# Patient Record
Sex: Female | Born: 1975 | Race: White | Hispanic: No | Marital: Married | Smoking: Former smoker
Health system: Southern US, Community
[De-identification: ages and names within clinical notes are randomized; demographics above are authoritative.]

## PROBLEM LIST (undated history)

## (undated) DIAGNOSIS — R569 Unspecified convulsions: Secondary | ICD-10-CM

## (undated) DIAGNOSIS — F32A Depression, unspecified: Secondary | ICD-10-CM

## (undated) DIAGNOSIS — T7840XA Allergy, unspecified, initial encounter: Secondary | ICD-10-CM

## (undated) DIAGNOSIS — K5190 Ulcerative colitis, unspecified, without complications: Secondary | ICD-10-CM

## (undated) DIAGNOSIS — F329 Major depressive disorder, single episode, unspecified: Secondary | ICD-10-CM

## (undated) DIAGNOSIS — F419 Anxiety disorder, unspecified: Secondary | ICD-10-CM

## (undated) HISTORY — DX: Allergy, unspecified, initial encounter: T78.40XA

## (undated) HISTORY — DX: Depression, unspecified: F32.A

## (undated) HISTORY — DX: Major depressive disorder, single episode, unspecified: F32.9

## (undated) HISTORY — DX: Unspecified convulsions: R56.9

## (undated) HISTORY — DX: Ulcerative colitis, unspecified, without complications: K51.90

## (undated) HISTORY — DX: Anxiety disorder, unspecified: F41.9

---

## 2003-01-12 ENCOUNTER — Other Ambulatory Visit: Admission: RE | Admit: 2003-01-12 | Discharge: 2003-01-12 | Payer: Self-pay | Admitting: Obstetrics and Gynecology

## 2004-02-28 ENCOUNTER — Other Ambulatory Visit: Admission: RE | Admit: 2004-02-28 | Discharge: 2004-02-28 | Payer: Self-pay | Admitting: Obstetrics and Gynecology

## 2005-03-26 ENCOUNTER — Other Ambulatory Visit: Admission: RE | Admit: 2005-03-26 | Discharge: 2005-03-26 | Payer: Self-pay | Admitting: Obstetrics and Gynecology

## 2007-08-04 IMAGING — CR DG CHEST 2V
1 series · 2 of 2 positions shown · non-contrast
Comparison: NONE

CLINICAL DATA: Shortness of breath. 

CHEST TWO VIEW (PA AND LATERAL)

[Series 1: view not recorded · 0.17mm/px · 2 of 2 slices shown]
[im 1/2]
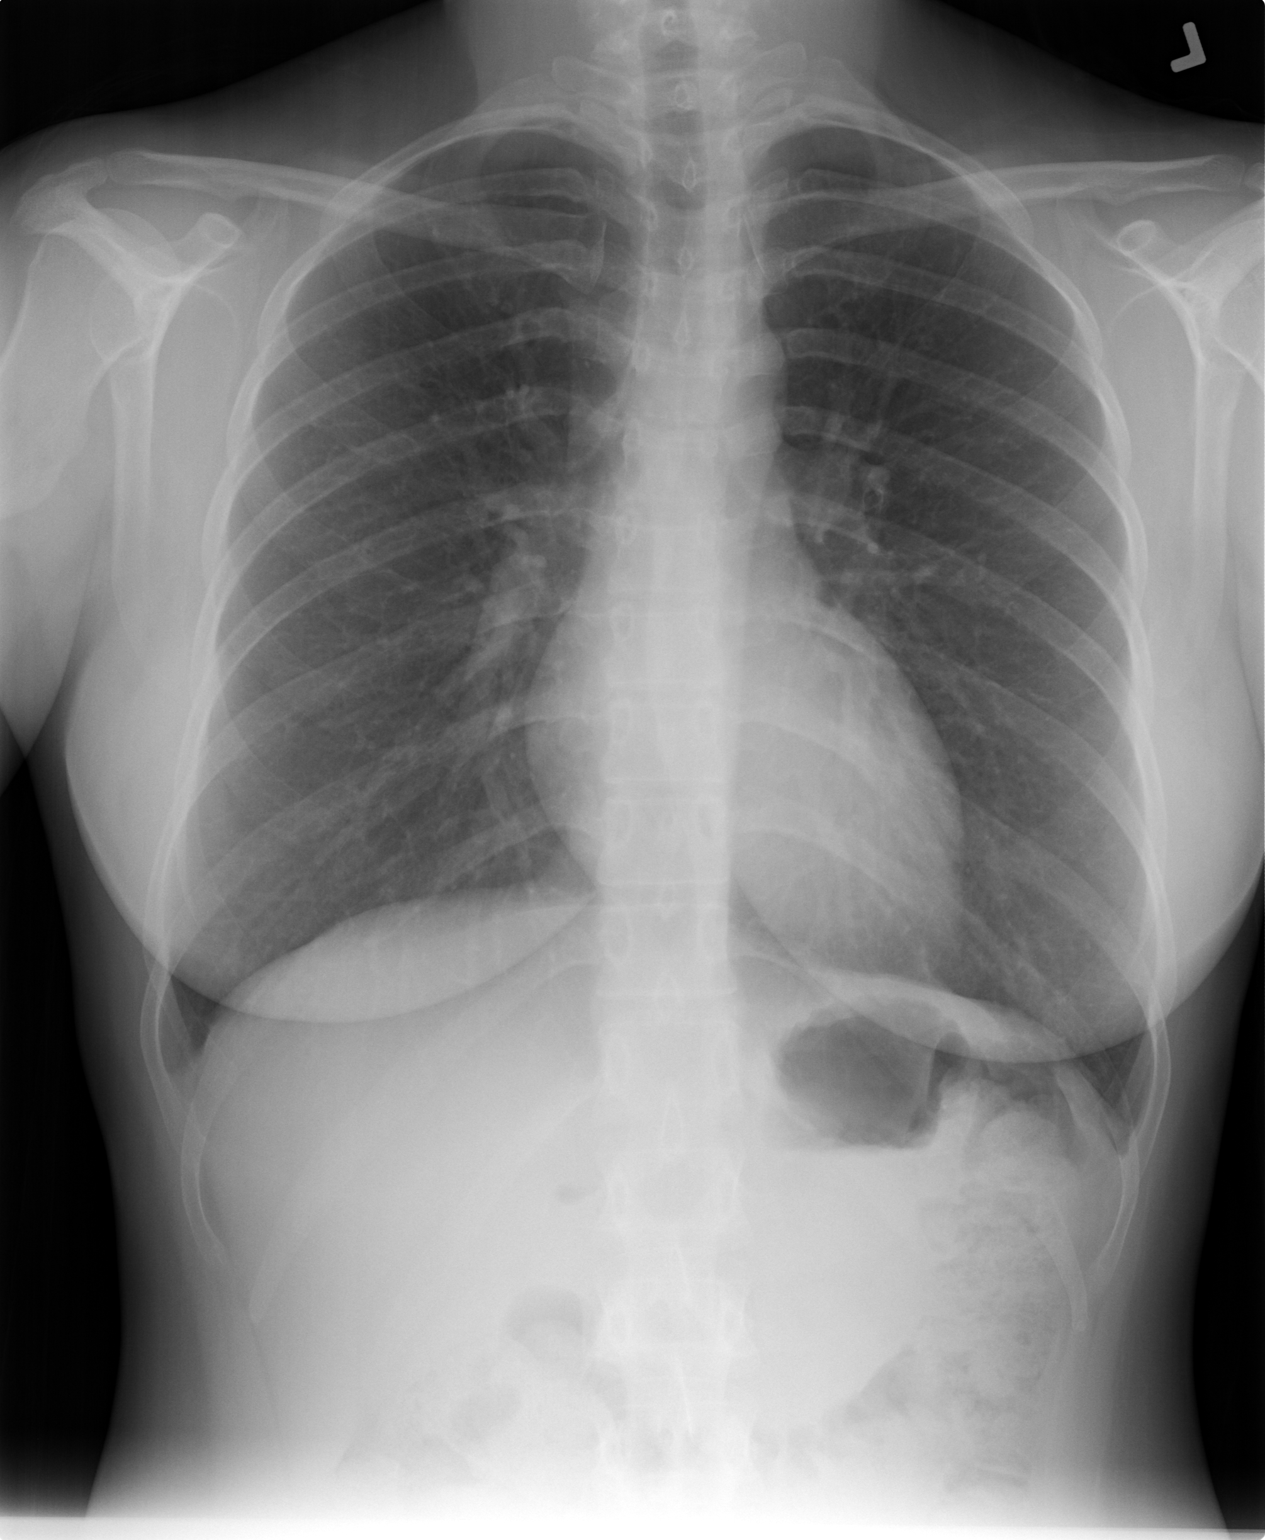
[im 2/2]
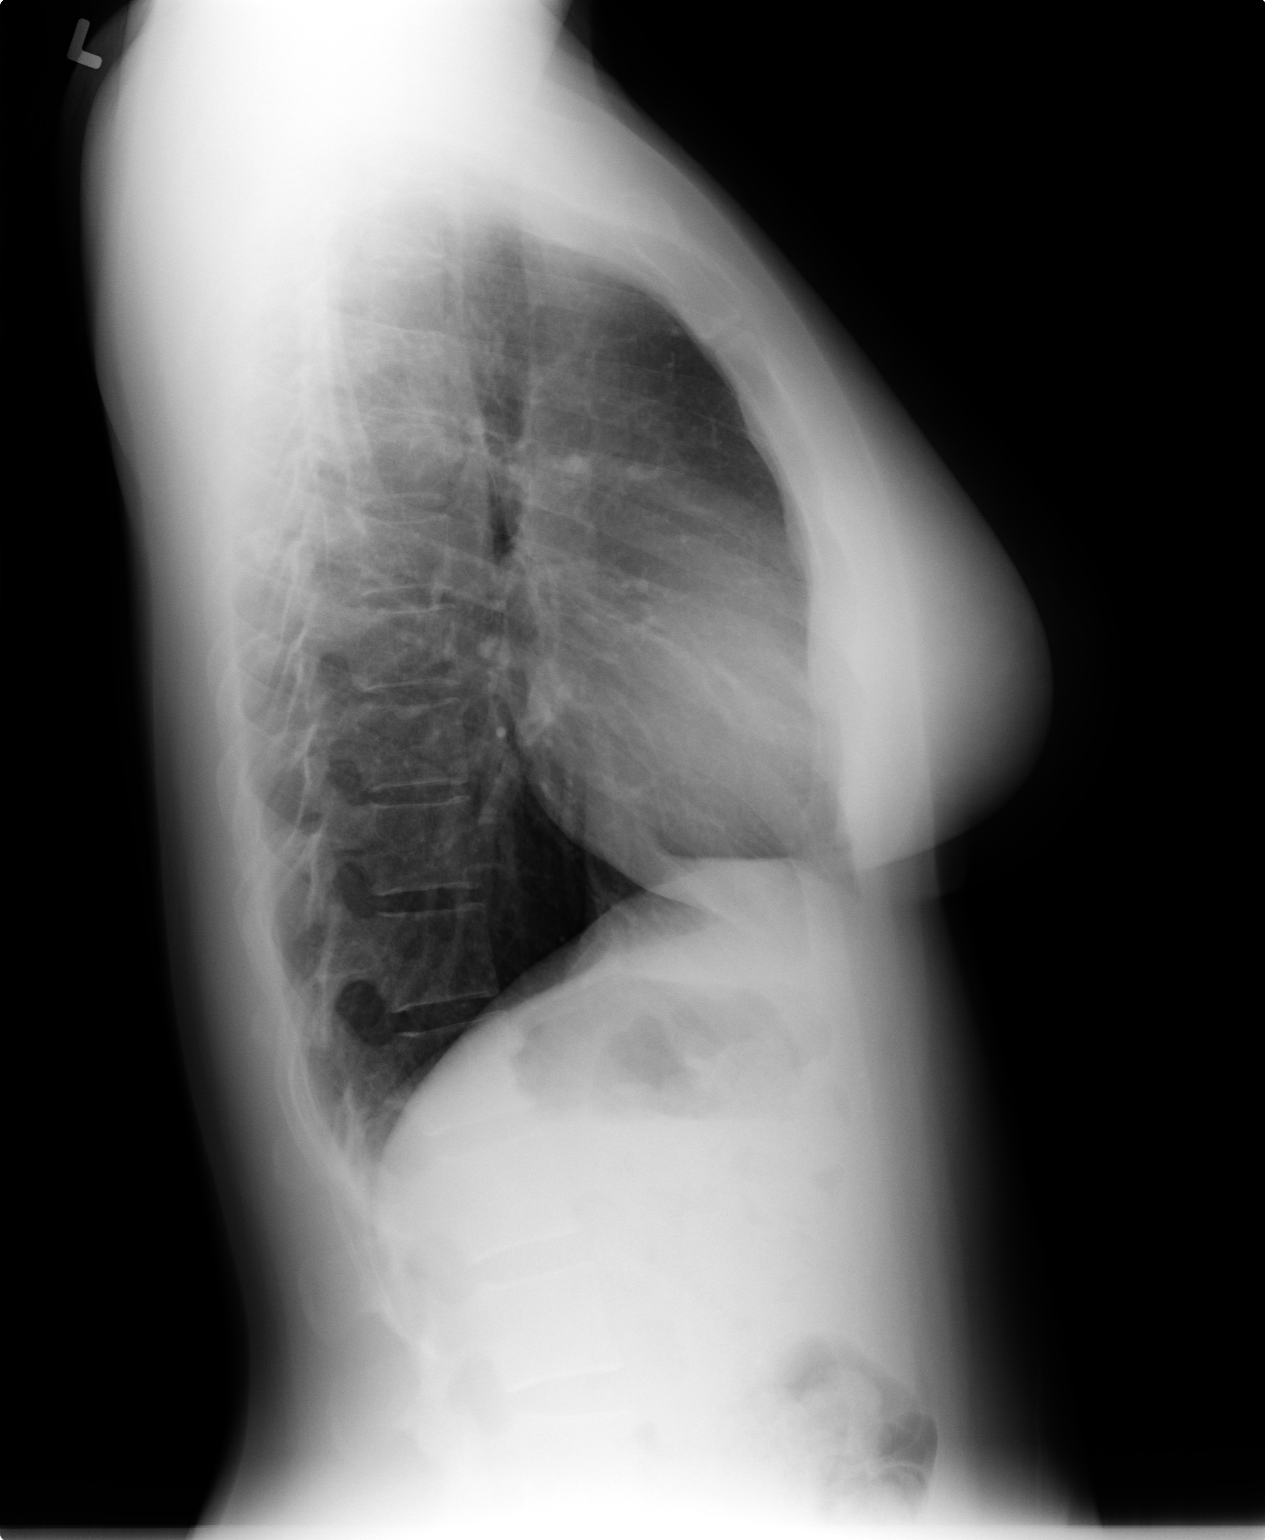

[2 of 2 positions shown; findings below may reference images not displayed]

FINDINGS: The lungs are clear and well expanded.   Heart and 
pulmonary vessels are normal.  No significant abnormalities are 
noted in the regional skeleton.  No evidence of thoracic aortic 
aneurysm or dissection. 

electronically reviewed on [DATE] Dict Date: [DATE]  Tran 
Date:  [DATE] DAS  [REDACTED]

## 2010-10-11 ENCOUNTER — Inpatient Hospital Stay (HOSPITAL_COMMUNITY)
Admission: AD | Admit: 2010-10-11 | Discharge: 2010-10-18 | DRG: 778 | Disposition: A | Discharge status: Home / Self Care | Payer: PRIVATE HEALTH INSURANCE | Source: Ambulatory Visit | Attending: Obstetrics and Gynecology | Admitting: Obstetrics and Gynecology

## 2010-10-11 DIAGNOSIS — O368390 Maternal care for abnormalities of the fetal heart rate or rhythm, unspecified trimester, not applicable or unspecified: Secondary | ICD-10-CM

## 2010-10-11 DIAGNOSIS — O4700 False labor before 37 completed weeks of gestation, unspecified trimester: Principal | ICD-10-CM | POA: Diagnosis present

## 2010-10-11 DIAGNOSIS — O4100X0 Oligohydramnios, unspecified trimester, not applicable or unspecified: Secondary | ICD-10-CM | POA: Diagnosis present

## 2010-10-11 DIAGNOSIS — O321XX0 Maternal care for breech presentation, not applicable or unspecified: Secondary | ICD-10-CM | POA: Diagnosis present

## 2010-10-11 LAB — CBC
Hemoglobin: 13 g/dL (ref 12.0–15.0)
MCH: 31.6 pg (ref 26.0–34.0)
MCHC: 33.9 g/dL (ref 30.0–36.0)
MCV: 93.2 fL (ref 78.0–100.0)
Platelets: 312 10*3/uL (ref 150–400)
RBC: 4.11 MIL/uL (ref 3.87–5.11)

## 2010-10-11 LAB — URINE MICROSCOPIC-ADD ON

## 2010-10-11 LAB — URINALYSIS, ROUTINE W REFLEX MICROSCOPIC
Glucose, UA: NEGATIVE mg/dL
Ketones, ur: NEGATIVE mg/dL
Nitrite: NEGATIVE
pH: 7 (ref 5.0–8.0)

## 2010-10-12 ENCOUNTER — Observation Stay (HOSPITAL_COMMUNITY): Payer: PRIVATE HEALTH INSURANCE

## 2010-10-12 LAB — URINE CULTURE
Colony Count: NO GROWTH
Culture  Setup Time: 201205240202

## 2010-10-13 LAB — URINALYSIS, ROUTINE W REFLEX MICROSCOPIC
Ketones, ur: NEGATIVE mg/dL
Nitrite: NEGATIVE
Protein, ur: NEGATIVE mg/dL
Urobilinogen, UA: 0.2 mg/dL (ref 0.0–1.0)

## 2010-10-13 LAB — URINE MICROSCOPIC-ADD ON

## 2010-10-14 ENCOUNTER — Inpatient Hospital Stay (HOSPITAL_COMMUNITY): Payer: PRIVATE HEALTH INSURANCE

## 2010-10-14 IMAGING — US US FETAL BPP W/O NONSTRESS
1 series · 9 of 9 positions shown · non-contrast
Comparison: none

[Series 1: us fetal bpp w/o nonstress · non-contrast · 9 acquisitions, 9 frames shown]
[im 1/9]
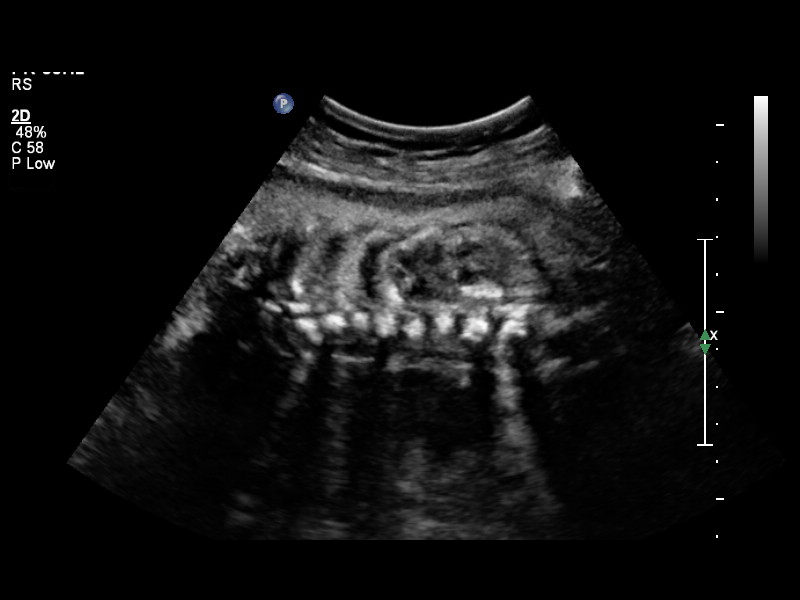
[im 2/9]
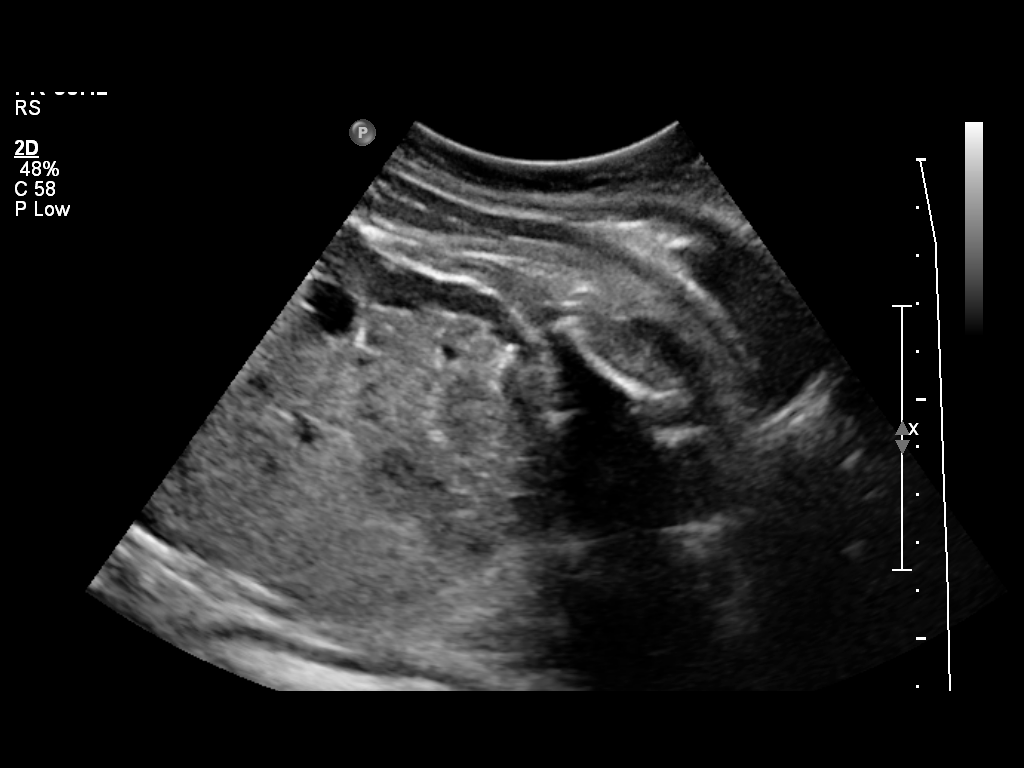
[im 3/9]
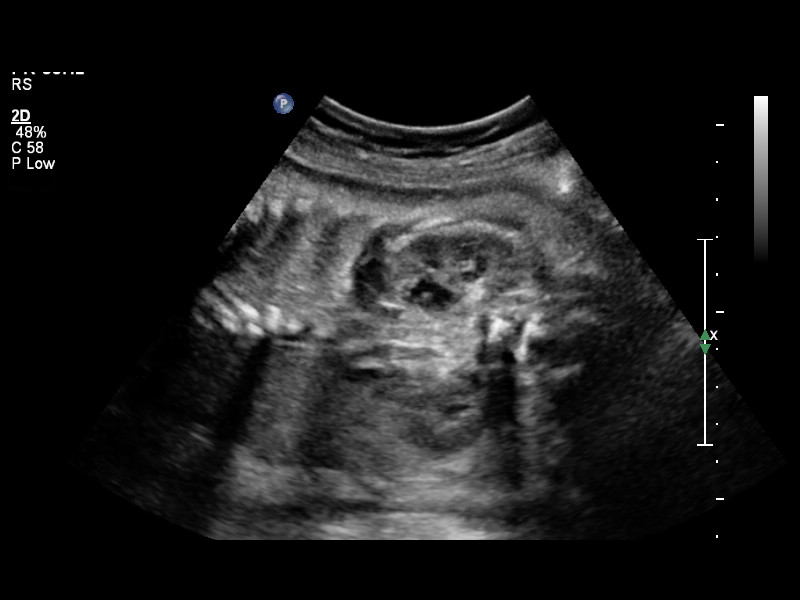
[im 4/9]
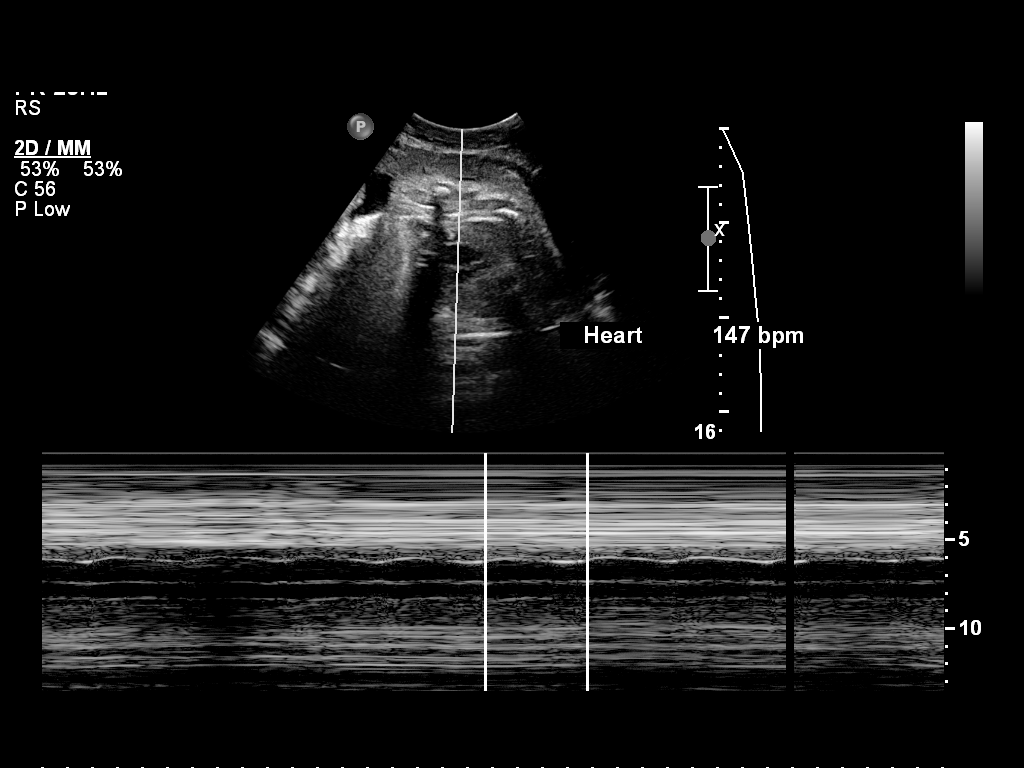
[im 5/9]
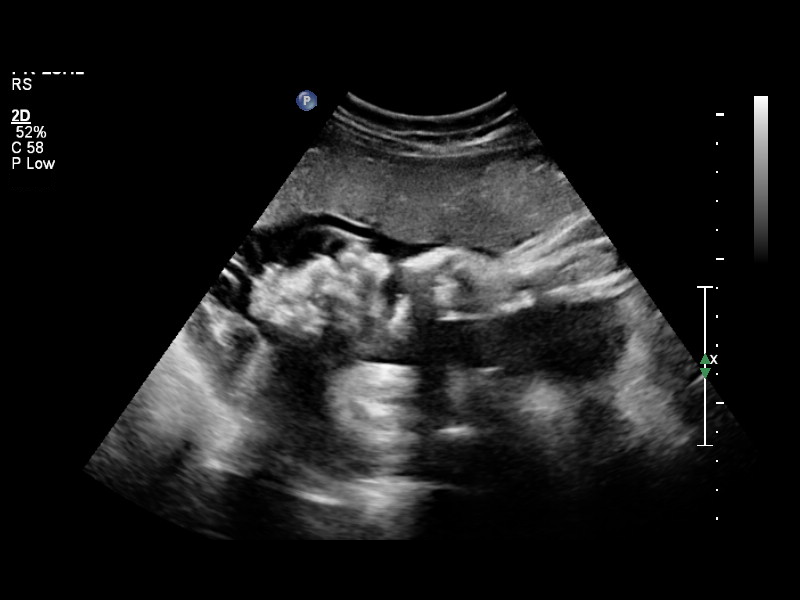
[im 6/9]
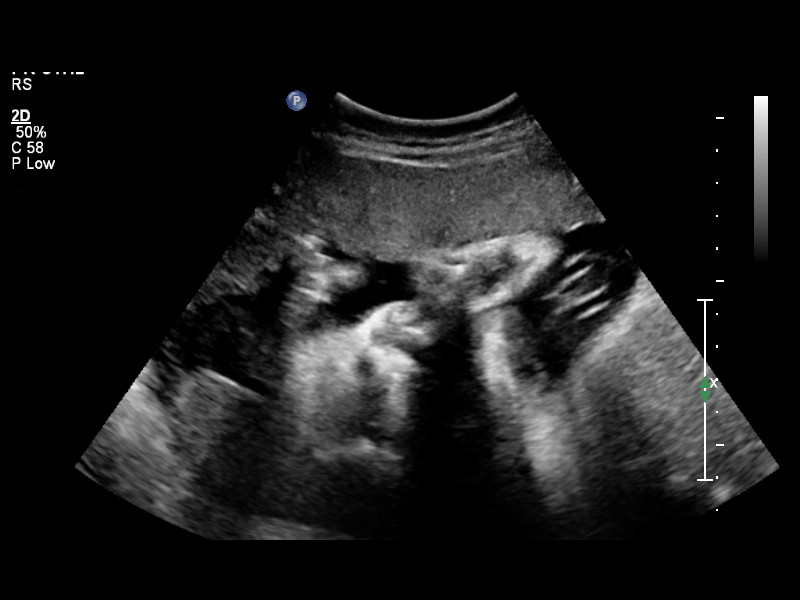
[im 7/9]
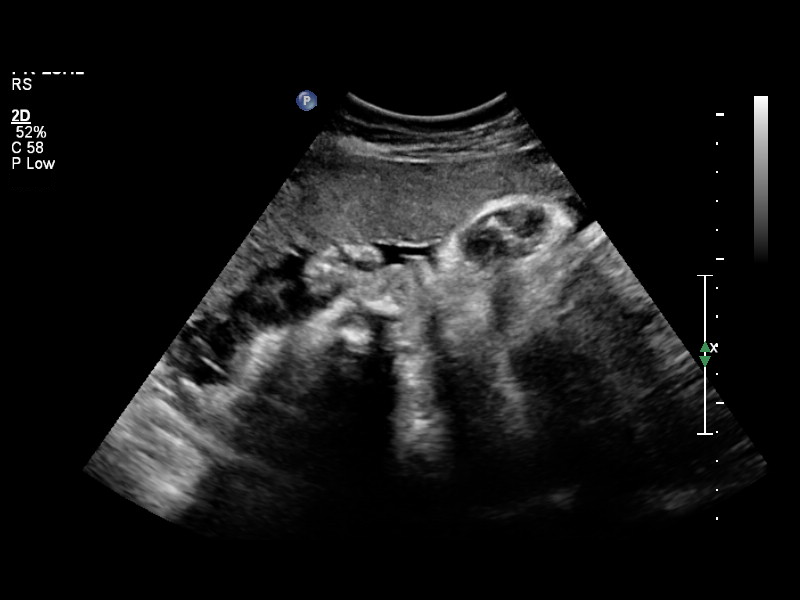
[im 8/9]
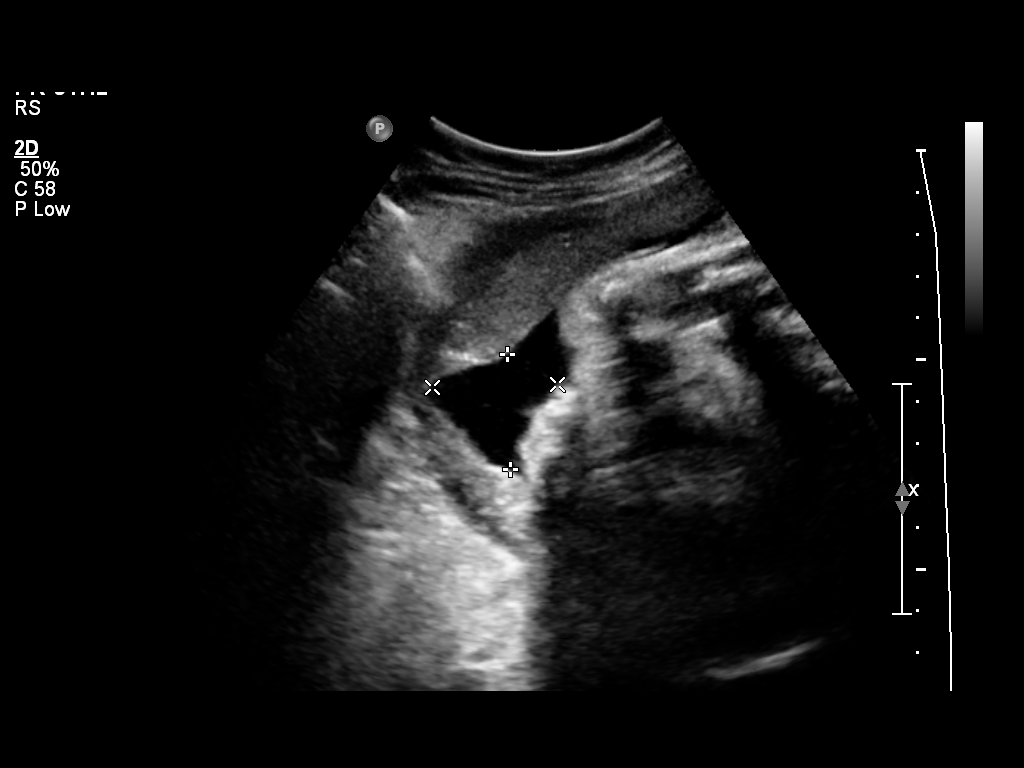
[im 9/9]
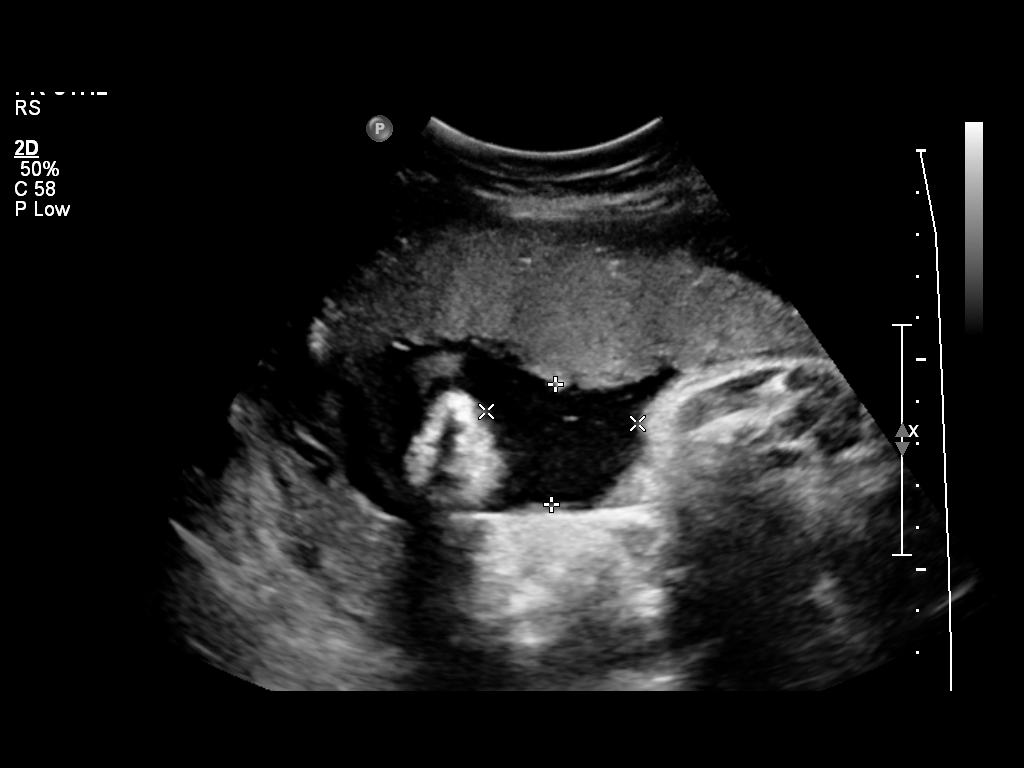

[9 of 9 positions shown; findings below may reference images not displayed]

OBSTETRICS REPORT
                      (Signed Final 10/14/2010 [DATE])

 Order#:         [id number]
Procedures

Indications

 Preterm labor (> 22 weeks)
Fetal Evaluation

 Fetal Heart Rate:  147                          bpm
 Cardiac Activity:  Observed
 Presentation:      Frank breech

 Amniotic Fluid
 AFI FV:      Subjectively decreased
                                             Larg Pckt:     2.9  cm
Biophysical Evaluation

 Amniotic F.V:   Pocket => 2 cm two         F. Tone:        Observed
                 planes
 F. Movement:    Observed                   Score:          [DATE]
 F. Breathing:   Observed
Gestational Age

 LMP:           34w 2d        Date:  02/16/10                 EDD:   11/23/10
 Best:          34w 2d     Det. By:  LMP  (02/16/10)          EDD:   11/23/10
Impression

 Single live IUP in breech presentation.
 BPP [DATE].

 questions or concerns.

## 2010-10-16 ENCOUNTER — Inpatient Hospital Stay (HOSPITAL_COMMUNITY): Payer: PRIVATE HEALTH INSURANCE

## 2010-10-17 ENCOUNTER — Inpatient Hospital Stay (HOSPITAL_COMMUNITY): Payer: PRIVATE HEALTH INSURANCE

## 2010-10-18 ENCOUNTER — Inpatient Hospital Stay (HOSPITAL_COMMUNITY): Payer: PRIVATE HEALTH INSURANCE

## 2010-10-18 ENCOUNTER — Ambulatory Visit (HOSPITAL_COMMUNITY): Payer: PRIVATE HEALTH INSURANCE

## 2010-10-20 ENCOUNTER — Other Ambulatory Visit (HOSPITAL_COMMUNITY): Payer: Self-pay | Admitting: Obstetrics and Gynecology

## 2010-10-20 ENCOUNTER — Ambulatory Visit (HOSPITAL_COMMUNITY)
Admission: RE | Admit: 2010-10-20 | Discharge: 2010-10-20 | Disposition: A | Discharge status: Home / Self Care | Payer: PRIVATE HEALTH INSURANCE | Source: Ambulatory Visit | Attending: Obstetrics and Gynecology | Admitting: Obstetrics and Gynecology

## 2010-10-20 DIAGNOSIS — Z3689 Encounter for other specified antenatal screening: Secondary | ICD-10-CM | POA: Insufficient documentation

## 2010-10-20 DIAGNOSIS — O4100X0 Oligohydramnios, unspecified trimester, not applicable or unspecified: Secondary | ICD-10-CM | POA: Insufficient documentation

## 2010-10-20 IMAGING — US US FETAL BPP W/O NONSTRESS
1 series · 6 of 6 positions shown · non-contrast
Comparison: none

[Series 1: us fetal bpp w/o nonstress · non-contrast · 6 acquisitions, 6 frames shown]
[im 1/6]
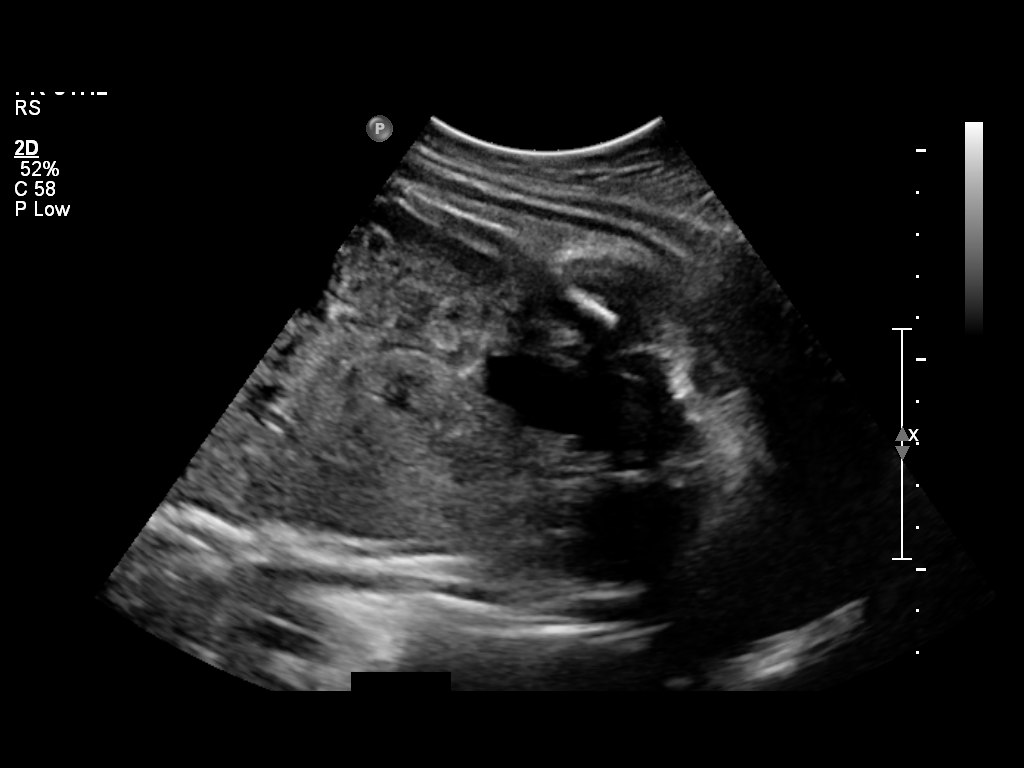
[im 2/6]
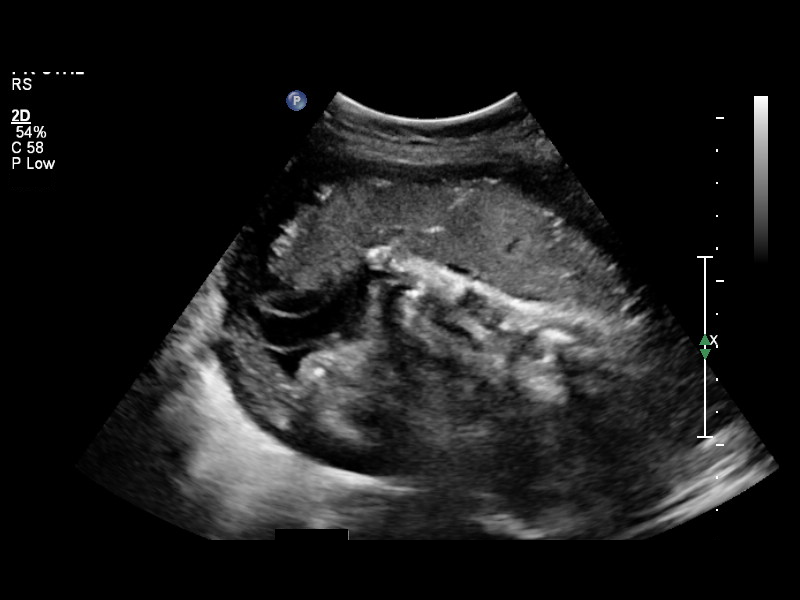
[im 3/6]
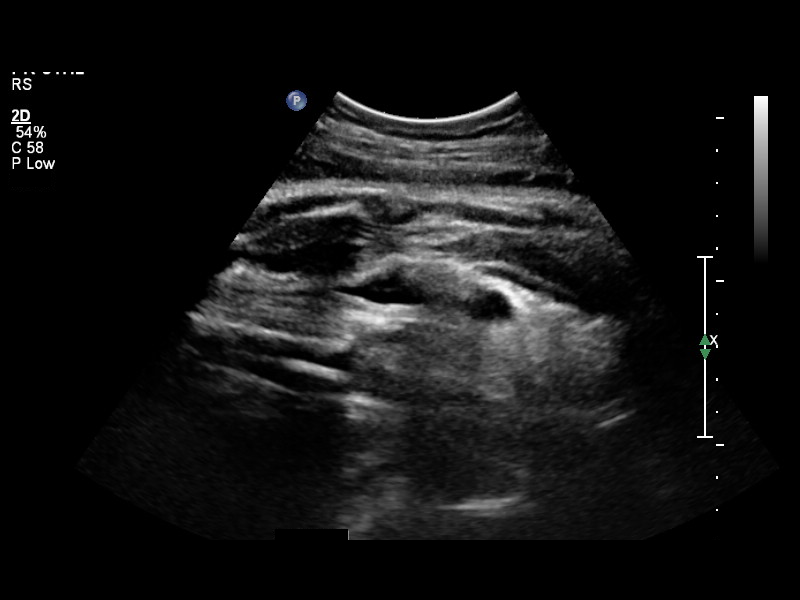
[im 4/6]
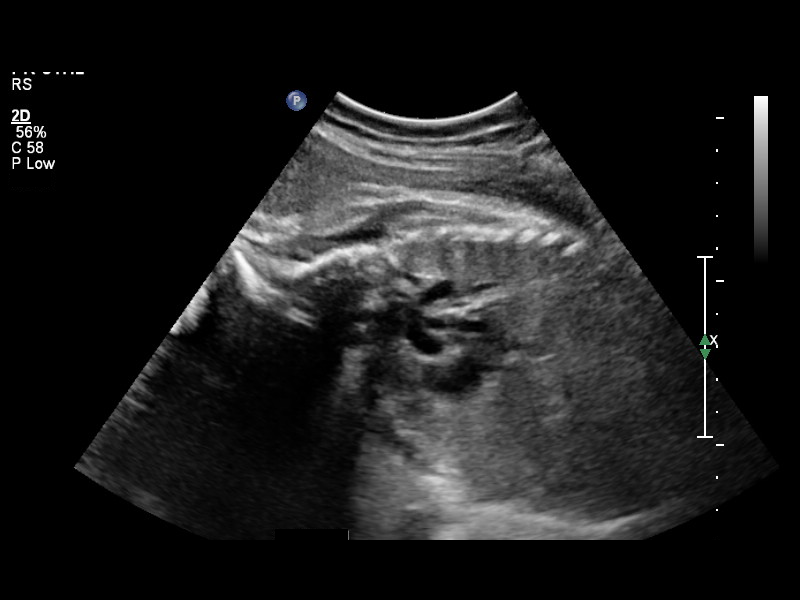
[im 5/6]
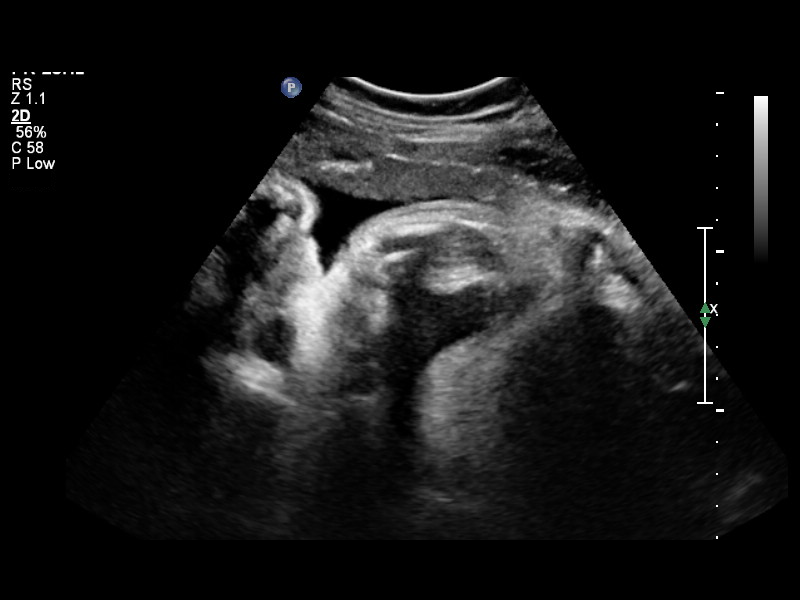
[im 6/6]
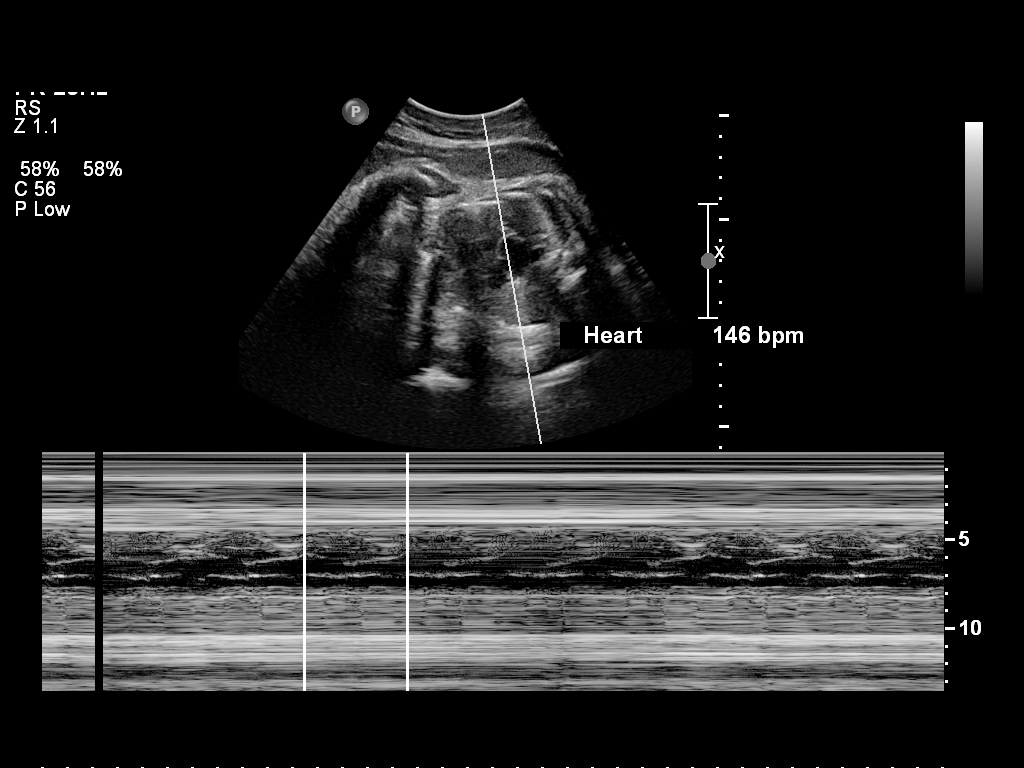

[6 of 6 positions shown; findings below may reference images not displayed]

OBSTETRICS REPORT
                      (Signed Final 10/20/2010 [DATE])

 Order#:         [id number]
Procedures

Indications

 Preterm labor (> 22 weeks)
 Oligohydramnios / Decreased amniotic fluid volume
 Assess fetal well being
Fetal Evaluation

 Fetal Heart Rate:  146                          bpm
 Cardiac Activity:  Observed
 Presentation:      Breech

 Comment:    BPP in 20 min

 Amniotic Fluid
 AFI FV:      Subjectively decreased
Biophysical Evaluation

 Amniotic F.V:   Pocket => 2 cm two         F. Tone:        Observed
                 planes
 F. Movement:    Observed                   Score:          [DATE]
 F. Breathing:   Observed
Gestational Age

 LMP:           35w 1d        Date:  02/16/10                 EDD:   11/23/10
 Best:          35w 1d     Det. By:  LMP  (02/16/10)          EDD:   11/23/10
Impression

 Biophysical profile score of [DATE].

 questions or concerns.

## 2010-11-02 ENCOUNTER — Other Ambulatory Visit: Payer: Self-pay | Admitting: Obstetrics and Gynecology

## 2010-11-02 ENCOUNTER — Inpatient Hospital Stay (HOSPITAL_COMMUNITY)
Admission: AD | Admit: 2010-11-02 | Discharge: 2010-11-05 | DRG: 765 | Disposition: A | Discharge status: Home / Self Care | Payer: PRIVATE HEALTH INSURANCE | Source: Ambulatory Visit | Attending: Obstetrics and Gynecology | Admitting: Obstetrics and Gynecology

## 2010-11-02 DIAGNOSIS — O321XX0 Maternal care for breech presentation, not applicable or unspecified: Principal | ICD-10-CM | POA: Diagnosis present

## 2010-11-02 DIAGNOSIS — G40909 Epilepsy, unspecified, not intractable, without status epilepticus: Secondary | ICD-10-CM | POA: Diagnosis present

## 2010-11-02 DIAGNOSIS — O99354 Diseases of the nervous system complicating childbirth: Secondary | ICD-10-CM | POA: Diagnosis present

## 2010-11-02 LAB — CBC
HCT: 37.8 % (ref 36.0–46.0)
Hemoglobin: 12.8 g/dL (ref 12.0–15.0)
MCH: 31.7 pg (ref 26.0–34.0)
MCHC: 33.9 g/dL (ref 30.0–36.0)
MCV: 93.6 fL (ref 78.0–100.0)

## 2010-11-03 LAB — CBC
HCT: 32.4 % — ABNORMAL LOW (ref 36.0–46.0)
Hemoglobin: 10.9 g/dL — ABNORMAL LOW (ref 12.0–15.0)
MCHC: 33.6 g/dL (ref 30.0–36.0)
RBC: 3.44 MIL/uL — ABNORMAL LOW (ref 3.87–5.11)

## 2010-11-03 LAB — RPR: RPR Ser Ql: NONREACTIVE

## 2010-11-04 LAB — RH IMMUNE GLOB WKUP(>/=20WKS)(NOT WOMEN'S HOSP)
Fetal Screen: NEGATIVE
Unit division: 0

## 2010-11-09 ENCOUNTER — Other Ambulatory Visit (HOSPITAL_COMMUNITY): Payer: PRIVATE HEALTH INSURANCE

## 2010-11-14 NOTE — Discharge Summary (Signed)
NAMEHAJA, CREGO NO.:  [id number]  MEDICAL RECORD NO.:  [id number]  LOCATION:  9158                          FACILITY:  [facility]  PHYSICIAN:  Duke Salvia. Marcelle Overlie, M.D.DATE OF BIRTH:  [birth year]  DATE OF ADMISSION:  10/12/2010 DATE OF DISCHARGE:  10/18/2010                              DISCHARGE SUMMARY   ADMITTING DIAGNOSES: 1. Intrauterine pregnancy at 32-4/7 weeks' estimated gestational age. 2. Preterm labor.  DISCHARGE DIAGNOSES: 1. Intrauterine pregnancy at 33-1/2 weeks' estimated gestational age. 2. Preterm labor, tocolyzed.  REASON FOR ADMISSION:  Please see written H and P.  HOSPITAL COURSE:  The patient is a 35 year old, primigravida who was admitted to [facility] at 32-4/7 weeks' estimated gestational age with preterm labor.  On admission, vital signs were stable.  Contractions were approximately every 2-4 minutes.  The patient was admitted and was started on magnesium sulfate for tocolysis of her labor.  Ultrasound was scheduled for the following morning.  On the following morning, the patient was on 2 g of magnesium sulfate.  The patient was examined and cervix was found to be closed, long, however, soft with vertex and pelvis bag of waters were intact.  Fetal heart tones were reactive.  Contractions were approximately every 6-8 minutes. The patient was given betamethasone for enhancement of fetal lung maturity and continued on the magnesium sulfate.  Later in the afternoon, ultrasound had revealed baby in the breech precipitation with oligohydramnios with AFI on the 4th percentile.  Biophysical profile revealed 8/8 score.  Tracing looked reactive.  On the following morning, the patient reported the contractions were approximately 10-15 minutes. She denied any vaginal bleeding or loss of fluid.  Baby was active. Vital signs were stable.  Abdomen soft.  Cervix fingertip, long, and soft.  The patient received the last of the  betamethasone.  Decision was made to change the patient from magnesium sulfate to Procardia and ultrasound was scheduled for the following morning.  Following morning, the patient continued to deny any contractions, vaginal bleeding, or loss of fluid.  Fetal heart tone had revealed 1 deceleration in the night lasting 7-8 minutes with spontaneous resolution with positional change.  Tachometer was quiet.  Ultrasound was scheduled for biophysical profile, AFI, and Doppler study.  The patient continued on Procardia every 6 hours.  On the following morning, AFI was in the 8th percentile. Abdomen soft.  No evidence of contractions.  Fetal heart tones were reassuring.  Over the course of the next several days, baby continued to have good movement, no contractions were observed.  AFI continued to increase.  Biophysical profile continued to be 6/8 and on Oct 18, 2010, after further discussion with Dr. Sherrie George biophysical profile was 8/8 with subjectively low normal fluid.  Decision was made to discharge the patient home on bedrest with return to the office for further biophysical profile and fetal studies in the next couple of days.  CONDITION ON DISCHARGE:  Stable.  DIET:  Regular as tolerated.  ACTIVITY:  Bedrest with bathroom privileges.  Instructions were given for the patient to call for decreasing fetal movement, loss of fluid, vaginal bleeding, or increasing contractions.  DISCHARGE MEDICATIONS:  1. Procardia 10 mg every 6 hours. 2. Prenatal vitamins 1 p.o. daily.     Julio Sicks, N.P.   ______________________________ Duke Salvia. Marcelle Overlie, M.D.    CC/MEDQ  D:  11/10/2010  T:  11/10/2010  Job:  [id number]  Electronically Signed by Julio Sicks N.P. on 11/13/2010 09:28:35 AM Electronically Signed by Richarda Overlie M.D. on 11/14/2010 01:41:19 PM

## 2010-11-16 ENCOUNTER — Inpatient Hospital Stay (HOSPITAL_COMMUNITY): Admission: RE | Admit: 2010-11-16 | Payer: Self-pay | Source: Ambulatory Visit | Admitting: Obstetrics and Gynecology

## 2010-11-17 NOTE — Op Note (Signed)
NAMEMORRIS, Mary NO.:  [id number]  MEDICAL RECORD NO.:  [id number]  LOCATION:  9132                          FACILITY:  [facility]  PHYSICIAN:  Guy Sandifer. Henderson Cloud, M.D. DATE OF BIRTH:  May 04, 1976  DATE OF PROCEDURE:  11/02/2010 DATE OF DISCHARGE:                              OPERATIVE REPORT   PREOPERATIVE DIAGNOSES: 1. Intrauterine pregnancy at 37-0/7 weeks' estimated additional age. 2. Breech position. 3. Spontaneous rupture of membranes. 4. Labor.  POSTOPERATIVE DIAGNOSES: 1. Intrauterine pregnancy at 37-0/7 weeks' estimated additional age. 2. Breech position. 3. Spontaneous rupture of membranes. 4. Labor.  PROCEDURE:  Low-transverse cesarean section.  SURGEON:  Guy Sandifer. Henderson Cloud, MD  ANESTHESIA:  Spinal, Leilani Able MD  SPECIMENS:  Placenta to pathology.  FINDINGS:  Viable female infant, Apgars of 8 and nine at 1 and five minutes respectively.  Birth weight 6 pounds 3 ounces.  Arterial cord pH 7.35.  ESTIMATED BLOOD LOSS:  750 mL.  INDICATIONS AND CONSENT:  This patient is a 35 year old married white female, G1, P0 at 37 weeks whose pregnancy has been complicated by a low amniotic fluid indices which had been closely monitored.  She has a history of a seizure disorder.  She has had one seizure in the last 10 years which occurred about 3 months prior to conception.  No medications.  She has a history of anxiety issues, but no medications currently.  She complains of spontaneous rupture of membranes about 6:22 p.m.  This was followed by the onset of contractions.  She presents to Maternity Admissions Unit, where positive fern is noted.  Cervix is 1-2, 50% breech per the nurse's check.  Bedside ultrasound done by me is consistent with breech.  Fetal heart tones were reactive and she is having contractions about every 2-4 minutes which are moderate to firm to palpation.  Recommendation for cesarean section was made.  Potential risks and  complications were discussed preoperatively including, but not limited to infection, organ damage, bleeding requiring transfusion of blood products with HIV and hepatitis acquisition, DVT, PE, pneumonia. All questions were answered and consent is signed on the chart.  PROCEDURE:  The patient was taken to the operating room, where she was identified.  Spinal anesthetic was placed per Dr. Arby Barrette.  She was placed in the dorsal supine position with a 15 degrees left lateral wedge.  Time-out was undertaken.  She was prepped.  Bladder was catheterized with a Foley to straight drain and she was draped in the sterile fashion.  After testing for adequate spinal anesthesia, skin was entered through a Pfannenstiel incision and dissection was carried out in layers to peritoneum.  Peritoneum was incised and extended superiorly and inferiorly.  Vesicouterine peritoneum was taken down cephalolaterally.  The bladder flap was developed and bladder blade was placed.  Uterus was incised in a low transverse manner.  The uterine cavity was entered bluntly with a hemostat.  The uterine incision was extended cephalolaterally with fingers.  Baby was then delivered from the frank breech position well without difficulty.  Good cry and tone was noted.  Cord was clamped and cut and the baby's head to awaiting pediatrics team.  Placenta was manually delivered and sent to pathology. Uterine cavity was clean.  Uterus was closed in 2 running locking imbricating layers of 0 Monocryl suture which achieves good hemostasis. Tubes and ovaries were normal bilaterally.  Anterior peritoneum was closed in running fashion with 0 Monocryl which was also used to reapproximate the pyramidal muscle from the midline.  Anterior rectus fascia was closed in running fashion with a 0 looped PDS suture and the skin was closed with clips.  All sponge, instrument and needle counts were correct and the patient was transferred to recovery  room in stable condition.     Guy Sandifer Henderson Cloud, M.D.     JET/MEDQ  D:  11/02/2010  T:  11/03/2010  Job:  [id number]  Electronically Signed by Harold Hedge M.D. on 11/17/2010 09:16:36 AM

## 2010-11-19 NOTE — Discharge Summary (Signed)
NAMESHARIFA, [name] NO.:  [id number]  MEDICAL RECORD NO.:  [id number]  LOCATION:  9132                          [facility]ILITY:  [facility]  PHYSICIAN:  Wendi Snipes, MD DATE OF BIRTH:  12-Nov-1975  DATE OF ADMISSION:  11/02/2010 DATE OF DISCHARGE:  11/05/2010                              DISCHARGE SUMMARY   ADMITTING DIAGNOSES: 1. Intrauterine pregnancy at 37 weeks estimated gestational age. 2. Spontaneous rupture of membranes with spontaneous onset of labor. 3. Breech presentation.  DISCHARGE DIAGNOSES: 1. Status post low-transverse cesarean section. 2. Viable female infant.  PROCEDURE:  Low-transverse cesarean section.  REASON FOR ADMISSION:  Please see written H and P.  HOSPITAL COURSE:  The patient is a 35 year old white married female gravida 1, para 0 who was admitted to [facility] at 37 weeks estimated gestational age with spontaneous onset of labor and spontaneous rupture of membranes.  The patient's pregnancy had been complicated by history of a seizure disorder.  She had had one seizure within the last 10 years, which occurred approximately 3 months prior to conception.  She was not on any medications.  On admission, vital signs were stable.  Cervix was examined and found to be 1-2 cm dilated, 50% effaced with breech presentation.  Bedside ultrasound was performed, which was consistent with the breech presentation.  Fetal heart tones were reactive and contractions were approximately every 2-4 minutes. Decision was made to proceed with low-transverse cesarean section.  The patient was then transferred the operating room where spinal anesthesia was administered without difficulty.  A low-transverse incision was made with delivery of a viable female infant weighing 6 pounds 3 ounces with at Apgars of 8 at 1 minute and 9 at 5 minutes.  Arterial cord pH of 7.35.  The patient tolerated the procedure well and was taken to the recovery  room in stable condition.  On postoperative day #1, the patient was without complaint.  Vital signs were stable.  Abdomen soft.  Fundus firm and nontender.  Abdominal dressing was noted to be clean, dry, and intact.  Foley was draining with adequate amounts of urine output. Laboratory findings showed hemoglobin 10.9, platelet count of 203,000, blood type is known to be O negative.  On postoperative day #2, the patient was without complaint.  Vital signs remained stable.  Abdomen soft.  Fundus firm and nontender.  Incision was clean, dry, and intact. On postoperative day #3, the patient remained without complaint.  Vital signs were stable.  Abdomen soft.  Fundus firm and nontender.  Discharge instructions were reviewed, and the patient was later discharged home.  CONDITION ON DISCHARGE:  Stable.  DIET:  Regular as tolerated.  ACTIVITY:  No heavy lifting, no driving x2 weeks, and no vaginal entry.  FOLLOWUP:  The patient is to follow up in the office in 1-2 weeks for incision check.  She is to call for temperature greater than 100 degrees, persistent nausea, vomiting, heavy vaginal bleeding, and/or redness or drainage from the incisional site.  DISCHARGE MEDICATIONS: 1. Tylox #30 one p.o. every 4-6 hours p.r.n. 2. Motrin 600 mg every 6 hours. 3. Prenatal vitamins 1 p.o. daily. 4. Colace  1 p.o. daily p.r.n.     Julio Sicks, N.P.   ______________________________ Wendi Snipes, MD    CC/MEDQ  D:  11/14/2010  T:  11/14/2010  Job:  [id number]  Electronically Signed by Julio Sicks N.P. on 11/15/2010 08:22:34 AM Electronically Signed by Jim Desanctis MD on 11/19/2010 10:06:47 AM

## 2010-11-23 ENCOUNTER — Inpatient Hospital Stay (HOSPITAL_COMMUNITY): Admission: AD | Admit: 2010-11-23 | Payer: Self-pay | Source: Ambulatory Visit | Admitting: Obstetrics and Gynecology

## 2011-05-22 DIAGNOSIS — K5190 Ulcerative colitis, unspecified, without complications: Secondary | ICD-10-CM

## 2011-05-22 HISTORY — DX: Ulcerative colitis, unspecified, without complications: K51.90

## 2011-07-16 ENCOUNTER — Ambulatory Visit (INDEPENDENT_AMBULATORY_CARE_PROVIDER_SITE_OTHER): Payer: PRIVATE HEALTH INSURANCE | Admitting: Internal Medicine

## 2011-07-16 DIAGNOSIS — R350 Frequency of micturition: Secondary | ICD-10-CM

## 2011-07-16 DIAGNOSIS — R300 Dysuria: Secondary | ICD-10-CM

## 2011-07-16 DIAGNOSIS — N390 Urinary tract infection, site not specified: Secondary | ICD-10-CM

## 2011-07-16 LAB — POCT UA - MICROSCOPIC ONLY
Casts, Ur, LPF, POC: NEGATIVE
Crystals, Ur, HPF, POC: NEGATIVE
Epithelial cells, urine per micros: NEGATIVE
Mucus, UA: NEGATIVE
Yeast, UA: NEGATIVE

## 2011-07-16 LAB — POCT URINALYSIS DIPSTICK
Ketones, UA: NEGATIVE
Spec Grav, UA: 1.015
Urobilinogen, UA: 0.2
pH, UA: 7.5

## 2011-07-16 MED ORDER — NITROFURANTOIN MONOHYD MACRO 100 MG PO CAPS: 100.0000 mg | ORAL_CAPSULE | Freq: Two times a day (BID) | ORAL | Status: AC

## 2011-07-16 NOTE — Patient Instructions (Signed)
Take all the medication and lots of fluids, return if not better in 2-3 days  Urinary Tract Infection Infections of the urinary tract can start in several places. A bladder infection (cystitis), a kidney infection (pyelonephritis), and a prostate infection (prostatitis) are different types of urinary tract infections (UTIs). They usually get better if treated with medicines (antibiotics) that kill germs. Take all the medicine until it is gone. You or your child may feel better in a few days, but TAKE ALL MEDICINE or the infection may not respond and may become more difficult to treat. HOME CARE INSTRUCTIONS   Drink enough water and fluids to keep the urine clear or pale yellow. Cranberry juice is especially recommended, in addition to large amounts of water.   Avoid caffeine, tea, and carbonated beverages. They tend to irritate the bladder.   Alcohol may irritate the prostate.   Only take over-the-counter or prescription medicines for pain, discomfort, or fever as directed by your caregiver.  To prevent further infections:  Empty the bladder often. Avoid holding urine for long periods of time.   After a bowel movement, women should cleanse from front to back. Use each tissue only once.   Empty the bladder before and after sexual intercourse.  FINDING OUT THE RESULTS OF YOUR TEST Not all test results are available during your visit. If your or your child's test results are not back during the visit, make an appointment with your caregiver to find out the results. Do not assume everything is normal if you have not heard from your caregiver or the medical facility. It is important for you to follow up on all test results. SEEK MEDICAL CARE IF:   There is back pain.   Your baby is older than 3 months with a rectal temperature of 100.5 F (38.1 C) or higher for more than 1 day.   Your or your child's problems (symptoms) are no better in 3 days. Return sooner if you or your child is getting  worse.  SEEK IMMEDIATE MEDICAL CARE IF:   There is severe back pain or lower abdominal pain.   You or your child develops chills.   You have a fever.   Your baby is older than 3 months with a rectal temperature of 102 F (38.9 C) or higher.   Your baby is 32 months old or younger with a rectal temperature of 100.4 F (38 C) or higher.   There is nausea or vomiting.   There is continued burning or discomfort with urination.  MAKE SURE YOU:   Understand these instructions.   Will watch your condition.   Will get help right away if you are not doing well or get worse.  Document Released: 02/14/2005 Document Revised: 01/17/2011 Document Reviewed: 09/19/2006 [facility] Patient Information 2012 [place], [place].

## 2011-07-16 NOTE — Progress Notes (Signed)
  Subjective:    Patient ID: [name], female    DOB: 03-04-1976, 36 y.o.   MRN: [id number]  Dysuria  This is a new problem. The current episode started in the past 7 days. The problem occurs intermittently. The quality of the pain is described as burning. There has been no fever. She is sexually active. There is no history of pyelonephritis. Associated symptoms include frequency and a possible pregnancy. Pertinent negatives include no chills, hematuria, sweats or vomiting. There is no history of catheterization or recurrent UTIs.  Urinary Frequency  Associated symptoms include frequency and a possible pregnancy. Pertinent negatives include no chills, hematuria, sweats or vomiting. There is no history of catheterization or recurrent UTIs.  [name] has a remote history of UTI.  She is sexually active and her last period was 3 weeks ago, she is not trying to conceive but states it is possible she is pregnant.  She has an 37 month old child at home.  She has not had any fever, chills but does have some back discomfort at times.    Review of Systems  Constitutional: Negative for chills.  Gastrointestinal: Negative for vomiting.  Genitourinary: Positive for dysuria and frequency. Negative for hematuria.       Objective:   Physical Exam  Vitals reviewed. Constitutional: She is oriented to person, place, and time. She appears well-developed and well-nourished.  HENT:  Head: Normocephalic.  Eyes: Conjunctivae are normal.  Cardiovascular: Normal rate, regular rhythm and normal heart sounds.   Pulmonary/Chest: Effort normal and breath sounds normal.  Abdominal: Soft. There is no tenderness.  Neurological: She is alert and oriented to person, place, and time.  Skin: Skin is warm and dry.  Psychiatric: She has a normal mood and affect. Her behavior is normal.          Assessment & Plan:  U/A shows nitrites and WBC so we will send a UC and start her on Macrobid for five days.  Pt instructions  given.

## 2011-07-19 ENCOUNTER — Other Ambulatory Visit: Payer: Self-pay | Admitting: Internal Medicine

## 2011-07-19 LAB — URINE CULTURE: Colony Count: >=100000

## 2011-07-19 MED ORDER — CIPROFLOXACIN HCL 250 MG PO TABS: 250.0000 mg | ORAL_TABLET | Freq: Two times a day (BID) | ORAL | Status: AC

## 2011-07-20 ENCOUNTER — Telehealth: Payer: Self-pay | Admitting: Radiology

## 2011-07-20 NOTE — Telephone Encounter (Signed)
LMOM of results and to start taking Cipro. Call back if there are any questions.

## 2012-01-03 ENCOUNTER — Other Ambulatory Visit: Payer: Self-pay | Admitting: Gastroenterology

## 2012-05-06 ENCOUNTER — Ambulatory Visit (INDEPENDENT_AMBULATORY_CARE_PROVIDER_SITE_OTHER): Payer: BC Managed Care – PPO | Admitting: Physician Assistant

## 2012-05-06 VITALS — BP 111/73 | HR 75 | Temp 98.5°F | Resp 17 | Ht 61.0 in | Wt 112.0 lb

## 2012-05-06 DIAGNOSIS — B085 Enteroviral vesicular pharyngitis: Secondary | ICD-10-CM

## 2012-05-06 DIAGNOSIS — J029 Acute pharyngitis, unspecified: Secondary | ICD-10-CM

## 2012-05-06 MED ORDER — VALACYCLOVIR HCL 1 G PO TABS: 1000.0000 mg | ORAL_TABLET | Freq: Two times a day (BID) | ORAL | Status: DC

## 2012-05-06 MED ORDER — AMOXICILLIN-POT CLAVULANATE 875-125 MG PO TABS: 1.0000 | ORAL_TABLET | Freq: Two times a day (BID) | ORAL | Status: DC

## 2012-05-06 MED ORDER — HYDROCODONE-ACETAMINOPHEN 5-325 MG PO TABS: 1.0000 | ORAL_TABLET | Freq: Four times a day (QID) | ORAL | Status: DC | PRN

## 2012-05-06 NOTE — Progress Notes (Signed)
Patient ID: [name] MRN: [id number], DOB: [birth year], 36 y.o. Date of Encounter: 05/06/2012, 7:45 PM  Primary Physician: No primary provider on file.  Chief Complaint:  Chief Complaint  Patient presents with  . Sore Throat    blisters in back of throat     HPI: 36 y.o. year old female presents with a one day history of right sided sore throat. Symptoms began with left sided sore throat this morning then progressed to right sided. This afternoon she noted a blister along the right side. She states this does not feel like her typical sore throats. Afebrile and without chills. She does have some referred right sided otalgia. She also notes some post nasal drip. No cough, congestion, rhinorrhea, sinus pressure, or headache. Normal hearing. No GI complaints. Able to swallow saliva, but hurts to do so. Decreased appetite secondary to sore throat.   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 She has never had a cold sore. She has not had any recent genital to oral sexual intercourse. No new sexual contacts.   Past Medical History  Diagnosis Date  . Allergy   . Depression   . Anxiety   . Seizures      Home Meds: Prior to Admission medications   Not on File    Allergies: No Known Allergies  History   Social History  . Marital Status: Married    Spouse Name: N/A    Number of Children: N/A  . Years of Education: N/A   Occupational History  . Not on file.   Social History Main Topics  . Smoking status: Former Smoker    Types: Cigarettes    Quit date: 07/15/2005  . Smokeless tobacco: Not on file  . Alcohol Use: 1.8 oz/week    3 Glasses of wine per week  . Drug Use: No  . Sexually Active: Yes    Birth Control/ Protection: None   Other Topics Concern  . Not on file   Social History Narrative  . No narrative on file     Review of Systems: Constitutional: negative for chills, fever,  night sweats or weight changes HEENT: see above Cardiovascular: negative for chest pain or palpitations Respiratory: see above Abdominal: negative for abdominal pain, nausea, or vomiting Dermatological: negative for rash Neurologic: negative for headache   Physical Exam: Blood pressure 111/73, pulse 75, temperature 98.5 F (36.9 C), temperature source Oral, resp. rate 17, height 5\' 1"  (1.549 m), weight 112 lb (50.803 kg), last menstrual period 05/06/2012, SpO2 100.00%., Body mass index is 21.16 kg/(m^2). General: Well developed, well nourished, in no acute distress. Head: Normocephalic, atraumatic, eyes without discharge, sclera non-icteric, nares are patent. Bilateral auditory canals clear, TM's are without perforation, pearly grey with reflective cone of light bilaterally. No mastoid TTP. No sinus TTP. Oral cavity moist, dentition normal. Posterior pharynx with post nasal drip and mild erythema. Isolated vesicle along right posterior pharynx. No peritonsillar abscess or tonsillar exudate. Uvula midline. Neck: Supple. No thyromegaly. Full ROM. Less than 2 cm AC bilaterally. Lungs: Clear bilaterally to auscultation without wheezes, rales, or rhonchi. Breathing is unlabored. Heart: RRR with S1 S2. No murmurs, rubs, or gallops appreciated. Msk:  Strength and tone normal for age. Extremities: No clubbing or cyanosis. No edema. Neuro: Alert and oriented X 3. Moves all extremities spontaneously. CNII-XII grossly in tact. Psych:  Responds to questions appropriately with  a normal affect.   Labs: Results for orders placed in visit on 05/06/12  POCT RAPID STREP A (OFFICE)      Component Value Range   Rapid Strep A Screen Negative  Negative    Throat culture and HSV throat culture pending. Declines blood work.  ASSESSMENT AND PLAN:  36 y.o. year old female with vesicular pharyngitis.  -Augmentin 875/125 mg 1 po bid #20 no RF -Valtrex 1000 mg 1 po bid #14 RF 1 -Norco 5/325 mg 1 po q 4-6  hours prn pain #30 no RF, SED -Motrin prn -Await culture results -Out of work 05/07/2012 -Rest/fluids -RTC precautions -RTC 3-5 days if no improvement  Signed, Eula Listen, PA-C 05/06/2012 7:45 PM

## 2012-05-09 LAB — CULTURE, GROUP A STREP

## 2012-11-06 ENCOUNTER — Ambulatory Visit (INDEPENDENT_AMBULATORY_CARE_PROVIDER_SITE_OTHER): Payer: BC Managed Care – PPO | Admitting: Physician Assistant

## 2012-11-06 VITALS — BP 118/62 | HR 67 | Temp 98.2°F | Resp 16 | Ht 61.0 in | Wt 108.0 lb

## 2012-11-06 DIAGNOSIS — K5120 Ulcerative (chronic) proctitis without complications: Secondary | ICD-10-CM | POA: Insufficient documentation

## 2012-11-06 DIAGNOSIS — R21 Rash and other nonspecific skin eruption: Secondary | ICD-10-CM

## 2012-11-06 LAB — POCT SKIN KOH: Skin KOH, POC: NEGATIVE

## 2012-11-06 MED ORDER — CLOTRIMAZOLE-BETAMETHASONE 1-0.05 % EX CREA: TOPICAL_CREAM | Freq: Two times a day (BID) | CUTANEOUS | Status: AC

## 2012-11-06 NOTE — Progress Notes (Signed)
   [address] [place] [address]   Phone [phone]  Subjective:    Patient ID: [name], female    DOB: August 29, 1975, 37 y.o.   MRN: [id number]  HPI Pt presents to clinic with ~3wk h/o rash that is sometimes itchy.  Started on her R arm but now that area has improved and is no longer raised.  But over the last several weeks she has noticed more areas - one of her neck and several on her abdomen.  She has never had a similar rash, though her daughter has a similar rash on her face currently (she is 35 y/o).  She had tried no creams or meds for this rash.     Review of Systems  Skin: Positive for rash.       Objective:   Physical Exam  Vitals reviewed. Constitutional: She is oriented to person, place, and time. She appears well-developed and well-nourished.  HENT:  Head: Normocephalic and atraumatic.  Right Ear: External ear normal.  Left Ear: External ear normal.  Eyes: Conjunctivae are normal.  Neck: Normal range of motion.  Pulmonary/Chest: Effort normal.  Neurological: She is alert and oriented to person, place, and time.  Skin: Skin is warm and dry. Rash noted. Rash is maculopapular. Rash is not pustular, not vesicular and not urticarial.  Raised erythematous slightly scaling rash on neck and multiple on her abd - largest is on her neck and about 2 cm big, most are <1cm big, no obvious central clearing  Psychiatric: She has a normal mood and affect. Her behavior is normal. Judgment and thought content normal.     Results for orders placed in visit on 11/06/12  POCT SKIN KOH      Result Value Range   Skin KOH, POC Negative          Assessment & Plan:  Rash and nonspecific skin eruption - Plan: POCT Skin KOH, clotrimazole-betamethasone (LOTRISONE) cream - the history makes me think that the patient has ring worm but with a neg skin scraping I do not have proof.  Will treat with lotrisone for both fungal and dry skin  If no better - f/u for biopsy   Benny [facility]ert [facility]  11/06/2012 7:39 PM

## 2014-05-06 ENCOUNTER — Other Ambulatory Visit: Payer: Self-pay | Admitting: Obstetrics and Gynecology

## 2014-05-10 LAB — CYTOLOGY - PAP

## 2019-02-09 ENCOUNTER — Other Ambulatory Visit: Payer: Self-pay

## 2019-02-09 DIAGNOSIS — Z20822 Contact with and (suspected) exposure to covid-19: Secondary | ICD-10-CM

## 2019-02-10 LAB — NOVEL CORONAVIRUS, NAA: SARS-CoV-2, NAA: NOT DETECTED

## 2019-09-03 ENCOUNTER — Ambulatory Visit: Payer: Self-pay

## 2019-09-05 ENCOUNTER — Ambulatory Visit: Payer: Self-pay | Attending: Internal Medicine

## 2019-09-05 DIAGNOSIS — Z23 Encounter for immunization: Secondary | ICD-10-CM

## 2019-09-05 NOTE — Progress Notes (Signed)
   Covid-19 Vaccination Clinic  Name:  [name]    MRN: [id number] DOB: [birth year]  09/05/2019  [name] was observed post Covid-19 immunization for 15 minutes without incident. She was provided with Vaccine Information Sheet and instruction to access the V-Safe system.   [name] was instructed to call 911 with any severe reactions post vaccine: Marland Kitchen Difficulty breathing  . Swelling of face and throat  . A fast heartbeat  . A bad rash all over body  . Dizziness and weakness   Immunizations Administered    Name Date Dose VIS Date Route   Pfizer COVID-19 Vaccine 09/05/2019  4:33 PM 0.3 mL 05/01/2019 Intramuscular   Manufacturer: [employer], [employer]   Lot: [id number]   NDC: [address]-8

## 2019-09-28 ENCOUNTER — Ambulatory Visit: Payer: Self-pay | Attending: Internal Medicine

## 2019-09-28 DIAGNOSIS — Z23 Encounter for immunization: Secondary | ICD-10-CM

## 2019-09-28 NOTE — Progress Notes (Signed)
   Covid-19 Vaccination Clinic  Name:  [name]    MRN: [id number] DOB: 1975/07/09  09/28/2019  [name] was observed post Covid-19 immunization for 15 minutes without incident. She was provided with Vaccine Information Sheet and instruction to access the V-Safe system.   [name] was instructed to call 911 with any severe reactions post vaccine: Marland Kitchen Difficulty breathing  . Swelling of face and throat  . A fast heartbeat  . A bad rash all over body  . Dizziness and weakness   Immunizations Administered    Name Date Dose VIS Date Route   Pfizer COVID-19 Vaccine 09/28/2019 10:23 AM 0.3 mL 07/15/2018 Intramuscular   Manufacturer: [employer], [employer]   Lot: [id number]   NDC: [address]-5

## 2020-05-23 ENCOUNTER — Ambulatory Visit: Payer: Self-pay | Attending: Internal Medicine

## 2020-05-23 DIAGNOSIS — Z23 Encounter for immunization: Secondary | ICD-10-CM

## 2020-05-23 NOTE — Progress Notes (Signed)
   Covid-19 Vaccination Clinic  Name:  Trenae Brunke    MRN: [id number] DOB: [birth year]  05/23/2020  Ms. Verley was observed post Covid-19 immunization for 15 minutes without incident. She was provided with Vaccine Information Sheet and instruction to access the V-Safe system.   Ms. Vinzant was instructed to call 911 with any severe reactions post vaccine: Marland Kitchen Difficulty breathing  . Swelling of face and throat  . A fast heartbeat  . A bad rash all over body  . Dizziness and weakness   Immunizations Administered    Name Date Dose VIS Date Route   Pfizer COVID-19 Vaccine 05/23/2020  2:06 PM 0.3 mL 03/09/2020 Intramuscular   Manufacturer: [employer], [employer]   Lot: [id number]   NDC: [address]-3

## 2020-06-01 ENCOUNTER — Other Ambulatory Visit: Payer: Self-pay

## 2020-06-01 DIAGNOSIS — Z20822 Contact with and (suspected) exposure to covid-19: Secondary | ICD-10-CM

## 2020-06-03 LAB — SARS-COV-2, NAA 2 DAY TAT

## 2020-06-03 LAB — NOVEL CORONAVIRUS, NAA: SARS-CoV-2, NAA: NOT DETECTED

## 2020-07-21 ENCOUNTER — Other Ambulatory Visit: Payer: Self-pay

## 2023-10-08 ENCOUNTER — Other Ambulatory Visit: Payer: Self-pay | Admitting: Physician Assistant

## 2023-10-08 DIAGNOSIS — Z1231 Encounter for screening mammogram for malignant neoplasm of breast: Secondary | ICD-10-CM
